# Patient Record
Sex: Male | Born: 2000 | Race: Black or African American | Hispanic: No | Marital: Single | Smoking: Never smoker
Health system: Southern US, Community
[De-identification: ages and names within clinical notes are randomized; demographics above are authoritative.]

## PROBLEM LIST (undated history)

## (undated) DIAGNOSIS — F913 Oppositional defiant disorder: Secondary | ICD-10-CM

## (undated) DIAGNOSIS — F909 Attention-deficit hyperactivity disorder, unspecified type: Secondary | ICD-10-CM

## (undated) DIAGNOSIS — Z62820 Parent-biological child conflict: Secondary | ICD-10-CM

## (undated) DIAGNOSIS — R4589 Other symptoms and signs involving emotional state: Secondary | ICD-10-CM

## (undated) DIAGNOSIS — F329 Major depressive disorder, single episode, unspecified: Secondary | ICD-10-CM

## (undated) DIAGNOSIS — G4700 Insomnia, unspecified: Secondary | ICD-10-CM

---
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
## 2021-02-03 DIAGNOSIS — Z419 Encounter for procedure for purposes other than remedying health state, unspecified: Secondary | ICD-10-CM | POA: Diagnosis not present

## 2021-03-06 DIAGNOSIS — Z419 Encounter for procedure for purposes other than remedying health state, unspecified: Secondary | ICD-10-CM | POA: Diagnosis not present

## 2021-04-06 DIAGNOSIS — Z419 Encounter for procedure for purposes other than remedying health state, unspecified: Secondary | ICD-10-CM | POA: Diagnosis not present

## 2021-05-04 DIAGNOSIS — Z419 Encounter for procedure for purposes other than remedying health state, unspecified: Secondary | ICD-10-CM | POA: Diagnosis not present
